# Patient Record
Sex: Female | Born: 1965 | Hispanic: Yes | Marital: Married | Smoking: Never smoker
Health system: Southern US, Community
[De-identification: ages and names within clinical notes are randomized; demographics above are authoritative.]

## PROBLEM LIST (undated history)

## (undated) HISTORY — PX: TONSILLECTOMY: SUR1361

---

## 2011-09-15 ENCOUNTER — Emergency Department
Admission: EM | Admit: 2011-09-15 | Discharge: 2011-09-15 | Disposition: A | Discharge status: Home / Self Care | Payer: 59 | Source: Home / Self Care

## 2011-09-15 ENCOUNTER — Encounter: Payer: Self-pay | Admitting: *Deleted

## 2011-09-15 DIAGNOSIS — J069 Acute upper respiratory infection, unspecified: Secondary | ICD-10-CM

## 2011-09-15 MED ORDER — BENZONATATE 100 MG PO CAPS: 100.0000 mg | ORAL_CAPSULE | Freq: Three times a day (TID) | ORAL | Status: AC | PRN

## 2011-09-15 MED ORDER — AZITHROMYCIN 250 MG PO TABS: ORAL_TABLET | ORAL | Status: AC

## 2011-09-15 NOTE — ED Notes (Signed)
Patient c/o cough x 1 week. Worsening cough in past 3 days. Taken Mucinex. Denies fever.

## 2011-09-15 NOTE — ED Provider Notes (Signed)
History     CSN: [id number]  Arrival date & time 09/15/11  0865   First MD Initiated Contact with Patient 09/15/11 [phone]      Chief Complaint  Patient presents with  . Cough   Patient is a 46 y.o. female presenting with cough.  Cough   URI Symptoms Onset: 1 week Description: rhinorrhea, nasal congestion, cough Modifying factors:  none  Symptoms Nasal discharge: yes Fever: no Sore throat: no Cough: yes Wheezing: no Ear pain: no GI symptoms: mild nausea Sick contacts: yes; coworker w/ ? Bronchitis vs. PNA  Red Flags  Stiff neck: no Dyspnea: no Rash: no Swallowing difficulty: no  Sinusitis Risk Factors Headache/face pain: no Double sickening: no tooth pain: no  Allergy Risk Factors Sneezing: no Itchy scratchy throat: no Seasonal symptoms: no  Flu Risk Factors Headache: no muscle aches: no severe fatigue: no   History reviewed. No pertinent past medical history.  Past Surgical History  Procedure Date  . Tonsillectomy     Family History  Problem Relation Age of Onset  . Heart attack Father   . Hypertension Father   . Hypertension Sister   . Hypertension Brother     History  Substance Use Topics  . Smoking status: Never Smoker   . Smokeless tobacco: Not on file  . Alcohol Use: No    OB History    Grav Para Term Preterm Abortions TAB SAB Ect Mult Living                  Review of Systems  Respiratory: Positive for cough.   All other systems reviewed and are negative.    Allergies  Review of patient's allergies indicates no known allergies.  Home Medications   Current Outpatient Rx  Name Route Sig Dispense Refill  . AZITHROMYCIN 250 MG PO TABS  Take 2 tabs PO x 1 dose, then 1 tab PO QD x 4 days 6 tablet 0  . BENZONATATE 100 MG PO CAPS Oral Take 1-2 capsules (100-200 mg total) by mouth 3 (three) times daily as needed for cough. 40 capsule 0    BP 113/78  Pulse 78  Temp 98.3 F (36.8 C) (Oral)  Resp 18  Ht 5' 2.5" (1.588 m)   Wt 132 lb (59.875 kg)  BMI 23.76 kg/m2  SpO2 100%  LMP 09/01/2011  Physical Exam  Constitutional: She is oriented to person, place, and time. She appears well-developed and well-nourished.  HENT:  Head: Normocephalic and atraumatic.  Right Ear: External ear normal.  Left Ear: External ear normal.       +nasal erythema, rhinorrhea bilaterally, + post oropharyngeal erythema    Eyes: Conjunctivae are normal. Pupils are equal, round, and reactive to light.  Neck: Normal range of motion. Neck supple.  Cardiovascular: Normal rate and regular rhythm.   Pulmonary/Chest: Effort normal and breath sounds normal. No respiratory distress. She has no wheezes. She has no rales.  Abdominal: Soft.  Musculoskeletal: Normal range of motion.  Neurological: She is alert and oriented to person, place, and time.  Skin: Skin is warm.    ED Course  Procedures (including critical care time)  Labs Reviewed - No data to display No results found.   1. URI (upper respiratory infection)       MDM  Viral URI with some concern for bronchitic changes.  Will rx tessalon perles Azithromycin for atypical coverage. Resp status currently reassuring.  Discussed infectious and resp red flags for reevaluation.  Handout given.  Follow up as needed.    The patient and/or caregiver has been counseled thoroughly with regard to treatment plan and/or medications prescribed including dosage, schedule, interactions, rationale for use, and possible side effects and they verbalize understanding. Diagnoses and expected course of recovery discussed and will return if not improved as expected or if the condition worsens. Patient and/or caregiver verbalized understanding.             Floydene Flock, MD 09/15/11 [phone]

## 2011-09-17 NOTE — ED Provider Notes (Signed)
Agree with exam, assessment, and plan.   Stephen A Beese, MD 09/17/11 1400 

## 2012-11-01 ENCOUNTER — Encounter: Payer: Self-pay | Admitting: *Deleted

## 2012-11-01 ENCOUNTER — Emergency Department
Admission: EM | Admit: 2012-11-01 | Discharge: 2012-11-01 | Disposition: A | Discharge status: Home / Self Care | Payer: BC Managed Care – PPO | Source: Home / Self Care | Attending: Family Medicine | Admitting: Family Medicine

## 2012-11-01 DIAGNOSIS — J069 Acute upper respiratory infection, unspecified: Secondary | ICD-10-CM

## 2012-11-01 NOTE — ED Provider Notes (Signed)
CSN: [id number]     Arrival date & time 11/01/12  1519 History   First MD Initiated Contact with Patient 11/01/12 1532     Chief Complaint  Patient presents with  . Cough    HPI  URI Symptoms Onset: 4 days  Description: rhinorrhea, post nasal drip, cough  Modifying factors:  None   Symptoms Nasal discharge: yes Fever: no Sore throat: no Cough: yes Wheezing: no Ear pain: no GI symptoms: no Sick contacts: no  Red Flags  Stiff neck: no Dyspnea: no Rash: no Swallowing difficulty: no  Sinusitis Risk Factors Headache/face pain: no Double sickening: no tooth pain: no  Allergy Risk Factors Sneezing: no Itchy scratchy throat: no Seasonal symptoms: no  Flu Risk Factors Headache: no muscle aches: no severe fatigue: no   History reviewed. No pertinent past medical history. Past Surgical History  Procedure Laterality Date  . Tonsillectomy     Family History  Problem Relation Age of Onset  . Heart attack Father   . Hypertension Father   . Hypertension Sister   . Hypertension Brother    History  Substance Use Topics  . Smoking status: Never Smoker   . Smokeless tobacco: Not on file  . Alcohol Use: No   OB History   Grav Para Term Preterm Abortions TAB SAB Ect Mult Living                 Review of Systems  All other systems reviewed and are negative.    Allergies  Review of patient's allergies indicates no known allergies.  Home Medications  No current outpatient prescriptions on file. BP 104/70  Pulse 96  Temp(Src) 98.2 F (36.8 C) (Oral)  Resp 16  Wt 134 lb (60.782 kg)  BMI 24.1 kg/m2  SpO2 98%  LMP 10/26/2012 Physical Exam  Constitutional: She appears well-developed and well-nourished.  HENT:  Head: Normocephalic and atraumatic.  Right Ear: External ear normal.  Left Ear: External ear normal.  +nasal erythema, rhinorrhea bilaterally, + post oropharyngeal erythema    Eyes: Conjunctivae are normal. Pupils are equal, round, and reactive  to light.  Neck: Normal range of motion. Neck supple.  Cardiovascular: Normal rate and regular rhythm.   Pulmonary/Chest: Effort normal and breath sounds normal. She has no wheezes. She has no rales.  Abdominal: Soft.  Musculoskeletal: Normal range of motion.  Neurological: She is alert.  Skin: Skin is warm.    ED Course  Procedures (including critical care time) Labs Review Labs Reviewed - No data to display Imaging Review No results found.  MDM   1. URI (upper respiratory infection)    Likely viral URI  Discussed supportive care and infectious/resp red flags.  Follow up as needed.     The patient and/or caregiver has been counseled thoroughly with regard to treatment plan and/or medications prescribed including dosage, schedule, interactions, rationale for use, and possible side effects and they verbalize understanding. Diagnoses and expected course of recovery discussed and will return if not improved as expected or if the condition worsens. Patient and/or caregiver verbalized understanding.         Doree Albee, MD 11/01/12 [phone]

## 2012-11-01 NOTE — ED Notes (Signed)
Pt c/o cough x 4 days, worse x 1 day. She reports that the cough is productive sometimes in the mornings. Denies fever. She has taken Mucinex.

## 2012-11-04 ENCOUNTER — Telehealth: Payer: Self-pay | Admitting: Emergency Medicine

## 2015-05-06 ENCOUNTER — Emergency Department
Admission: EM | Admit: 2015-05-06 | Discharge: 2015-05-06 | Disposition: A | Discharge status: Home / Self Care | Payer: BLUE CROSS/BLUE SHIELD | Source: Home / Self Care | Attending: Family Medicine | Admitting: Family Medicine

## 2015-05-06 ENCOUNTER — Encounter: Payer: Self-pay | Admitting: *Deleted

## 2015-05-06 DIAGNOSIS — R053 Chronic cough: Secondary | ICD-10-CM

## 2015-05-06 DIAGNOSIS — J069 Acute upper respiratory infection, unspecified: Secondary | ICD-10-CM

## 2015-05-06 DIAGNOSIS — R05 Cough: Secondary | ICD-10-CM | POA: Diagnosis not present

## 2015-05-06 MED ORDER — PREDNISONE 20 MG PO TABS: ORAL_TABLET | ORAL | Status: DC

## 2015-05-06 MED ORDER — DOXYCYCLINE HYCLATE 100 MG PO CAPS: 100.0000 mg | ORAL_CAPSULE | Freq: Two times a day (BID) | ORAL | Status: DC

## 2015-05-06 MED ORDER — BENZONATATE 100 MG PO CAPS: 100.0000 mg | ORAL_CAPSULE | Freq: Three times a day (TID) | ORAL | Status: DC

## 2015-05-06 NOTE — ED Notes (Signed)
Pt c/o 2 weeks of cough, congestion, HA and sneezing. Taken Mucinex and cough drops. Before leaving [place] 8 days ago, took 3 days of 500mg  azithromycin.

## 2015-05-06 NOTE — Discharge Instructions (Signed)
You may take 400-600mg  Ibuprofen (Motrin) every 6-8 hours for fever and pain  Alternate with Tylenol  You may take 500mg  Tylenol every 4-6 hours as needed for fever and pain  Follow-up with your primary care provider next week for recheck of symptoms if not improving.  Be sure to drink plenty of fluids and rest, at least 8hrs of sleep a night, preferably more while you are sick. Return urgent care or go to closest ER if you cannot keep down fluids/signs of dehydration, fever not reducing with Tylenol, difficulty breathing/wheezing, stiff neck, worsening condition, or other concerns (see below)   If coughing not improving with cough medication and prednisone, you may start taking the antibiotics in 2-3 days, or if persistent fever develops.  Cool Mist Vaporizers Vaporizers may help relieve the symptoms of a cough and cold. They add moisture to the air, which helps mucus to become thinner and less sticky. This makes it easier to breathe and cough up secretions. Cool mist vaporizers do not cause serious burns like hot mist vaporizers, which may also be called steamers or humidifiers. Vaporizers have not been proven to help with colds. You should not use a vaporizer if you are allergic to mold. HOME CARE INSTRUCTIONS  Follow the package instructions for the vaporizer.  Do not use anything other than distilled water in the vaporizer.  Do not run the vaporizer all of the time. This can cause mold or bacteria to grow in the vaporizer.  Clean the vaporizer after each time it is used.  Clean and dry the vaporizer well before storing it.  Stop using the vaporizer if worsening respiratory symptoms develop.   This information is not intended to replace advice given to you by your health care provider. Make sure you discuss any questions you have with your health care provider.   Document Released: 10/31/2003 Document Revised: 02/07/2013 Document Reviewed: 06/22/2012 Elsevier Interactive Patient  Education 2016 [employer]  Cough, Adult A cough helps to clear your throat and lungs. A cough may last only 2-3 weeks (acute), or it may last longer than 8 weeks (chronic). Many different things can cause a cough. A cough may be a sign of an illness or another medical condition. HOME CARE  Pay attention to any changes in your cough.  Take medicines only as told by your doctor.  If you were prescribed an antibiotic medicine, take it as told by your doctor. Do not stop taking it even if you start to feel better.  Talk with your doctor before you try using a cough medicine.  Drink enough fluid to keep your pee (urine) clear or pale yellow.  If the air is dry, use a cold steam vaporizer or humidifier in your home.  Stay away from things that make you cough at work or at home.  If your cough is worse at night, try using extra pillows to raise your head up higher while you sleep.  Do not smoke, and try not to be around smoke. If you need help quitting, ask your doctor.  Do not have caffeine.  Do not drink alcohol.  Rest as needed. GET HELP IF:  You have new problems (symptoms).  You cough up yellow fluid (pus).  Your cough does not get better after 2-3 weeks, or your cough gets worse.  Medicine does not help your cough and you are not sleeping well.  You have pain that gets worse or pain that is not helped with medicine.  You have  a fever.  You are losing weight and you do not know why.  You have night sweats. GET HELP RIGHT AWAY IF:  You cough up blood.  You have trouble breathing.  Your heartbeat is very fast.   This information is not intended to replace advice given to you by your health care provider. Make sure you discuss any questions you have with your health care provider.   Document Released: 10/16/2010 Document Revised: 10/24/2014 Document Reviewed: 04/11/2014 Elsevier Interactive Patient Education [employer]

## 2015-05-06 NOTE — ED Provider Notes (Signed)
CSN: [id number]     Arrival date & time 05/06/15  [id number] History   First MD Initiated Contact with Patient 05/06/15 1003     Chief Complaint  Patient presents with  . Cough  . Nasal Congestion   (Consider location/radiation/quality/duration/timing/severity/associated sxs/prior Treatment) HPI  The pt is a 50yo female presenting to [facility] with c/o 2 weeks of cough, congestion, generalized headache and sneezing.  She has been taking OTC Mucinex and using cough drops but only minimal relief.  She does report taking 3 days of 500mg  Azithromycin 8 days ago when she was in [place] but states she only felt better for about 3-4 days, then cough returned.  Denies hx of asthma. Her husband is sick at home but does not have much of a cough.  Denies fever, chills, n/v/d.  History reviewed. No pertinent past medical history. Past Surgical History  Procedure Laterality Date  . Tonsillectomy     Family History  Problem Relation Age of Onset  . Heart attack Father   . Hypertension Father   . Hypertension Sister   . Hypertension Brother    Social History  Substance Use Topics  . Smoking status: Never Smoker   . Smokeless tobacco: None  . Alcohol Use: No   OB History    No data available     Review of Systems  Constitutional: Negative for fever and chills.  HENT: Positive for congestion and sneezing. Negative for ear pain, sore throat, trouble swallowing and voice change.   Respiratory: Positive for cough. Negative for shortness of breath.   Cardiovascular: Negative for chest pain and palpitations.  Gastrointestinal: Negative for nausea, vomiting, abdominal pain and diarrhea.  Musculoskeletal: Negative for myalgias, back pain and arthralgias.  Skin: Negative for rash.  Neurological: Positive for headaches. Negative for dizziness and light-headedness.    Allergies  Review of patient's allergies indicates no known allergies.  Home Medications   Prior to Admission medications   Medication  Sig Start Date End Date Taking? Authorizing Provider  benzonatate (TESSALON) 100 MG capsule Take 1-2 capsules (100-200 mg total) by mouth every 8 (eight) hours. 05/06/15   Junius FinnerErin O'Malley, PA-C  doxycycline (VIBRAMYCIN) 100 MG capsule Take 1 capsule (100 mg total) by mouth 2 (two) times daily. One po bid x 7 days 05/06/15   Junius FinnerErin O'Malley, PA-C  predniSONE (DELTASONE) 20 MG tablet 3 tabs po day one, then 2 po daily x 4 days 05/06/15   Junius FinnerErin O'Malley, PA-C   Meds Ordered and Administered this Visit  Medications - No data to display  BP 129/72 mmHg  Pulse 88  Temp(Src) 97.9 F (36.6 C) (Oral)  Resp 16  Wt 138 lb (62.596 kg)  SpO2 99%  LMP 04/22/2015 No data found.   Physical Exam  Constitutional: She appears well-developed and well-nourished. No distress.  HENT:  Head: Normocephalic and atraumatic.  Right Ear: Tympanic membrane normal.  Left Ear: Tympanic membrane normal.  Nose: Nose normal.  Mouth/Throat: Uvula is midline, oropharynx is clear and moist and mucous membranes are normal.  Eyes: Conjunctivae are normal. No scleral icterus.  Neck: Normal range of motion. Neck supple.  Cardiovascular: Normal rate, regular rhythm and normal heart sounds.   Pulmonary/Chest: Effort normal and breath sounds normal. No stridor. No respiratory distress. She has no wheezes. She has no rales. She exhibits no tenderness.  Lungs: clear, intermittent mild dry cough during exam.  Abdominal: Soft. She exhibits no distension. There is no tenderness.  Musculoskeletal: Normal range of motion.  Lymphadenopathy:    She has no cervical adenopathy.  Neurological: She is alert.  Skin: Skin is warm and dry. She is not diaphoretic.  Nursing note and vitals reviewed.   ED Course  Procedures (including critical care time)  Labs Review Labs Reviewed - No data to display  Imaging Review No results found.   MDM   1. Persistent cough   2. Acute upper respiratory infection    Pt c/o persistent cough  despite completing a course of Azithromycin 1 week ago.  Lungs: CTAB. Pt is afebrile, O2 Sat 99% on RA  Rx: prednisone and tessalon Prescription for doxycycline provided. Advised to fill in 2-3 days if symptoms not improving or persistent fever develops.  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 Patient verbalized understanding and agreement with treatment plan.     Junius Finner, PA-C 05/06/15 1028

## 2019-12-15 DIAGNOSIS — N390 Urinary tract infection, site not specified: Secondary | ICD-10-CM | POA: Diagnosis not present

## 2020-01-25 ENCOUNTER — Other Ambulatory Visit: Payer: Self-pay | Admitting: Internal Medicine

## 2020-01-25 DIAGNOSIS — Z780 Asymptomatic menopausal state: Secondary | ICD-10-CM | POA: Diagnosis not present

## 2020-01-25 DIAGNOSIS — Z114 Encounter for screening for human immunodeficiency virus [HIV]: Secondary | ICD-10-CM | POA: Diagnosis not present

## 2020-01-25 DIAGNOSIS — R69 Illness, unspecified: Secondary | ICD-10-CM | POA: Diagnosis not present

## 2020-01-25 DIAGNOSIS — Z1231 Encounter for screening mammogram for malignant neoplasm of breast: Secondary | ICD-10-CM

## 2020-01-25 DIAGNOSIS — Z0000 Encounter for general adult medical examination without abnormal findings: Secondary | ICD-10-CM | POA: Diagnosis not present

## 2020-01-25 DIAGNOSIS — Z1159 Encounter for screening for other viral diseases: Secondary | ICD-10-CM | POA: Diagnosis not present

## 2020-02-07 DIAGNOSIS — Z20822 Contact with and (suspected) exposure to covid-19: Secondary | ICD-10-CM | POA: Diagnosis not present

## 2020-02-29 DIAGNOSIS — Z20822 Contact with and (suspected) exposure to covid-19: Secondary | ICD-10-CM | POA: Diagnosis not present

## 2020-03-05 DIAGNOSIS — Z20822 Contact with and (suspected) exposure to covid-19: Secondary | ICD-10-CM | POA: Diagnosis not present

## 2020-03-11 DIAGNOSIS — Z20822 Contact with and (suspected) exposure to covid-19: Secondary | ICD-10-CM | POA: Diagnosis not present

## 2020-05-08 ENCOUNTER — Other Ambulatory Visit: Payer: Self-pay | Admitting: *Deleted

## 2020-05-08 ENCOUNTER — Other Ambulatory Visit: Payer: Self-pay

## 2020-05-08 ENCOUNTER — Inpatient Hospital Stay
Admission: RE | Admit: 2020-05-08 | Discharge: 2020-05-08 | Disposition: A | Discharge status: Home / Self Care | Payer: Self-pay | Source: Ambulatory Visit | Attending: *Deleted | Admitting: *Deleted

## 2020-05-08 ENCOUNTER — Ambulatory Visit
Admission: RE | Admit: 2020-05-08 | Discharge: 2020-05-08 | Disposition: A | Discharge status: Home / Self Care | Payer: 59 | Source: Ambulatory Visit | Attending: Internal Medicine | Admitting: Internal Medicine

## 2020-05-08 DIAGNOSIS — Z1231 Encounter for screening mammogram for malignant neoplasm of breast: Secondary | ICD-10-CM | POA: Diagnosis not present

## 2021-04-24 IMAGING — MG MM DIGITAL SCREENING BILAT W/ TOMO AND CAD
8 series · 8 of 24 positions shown · non-contrast
Comparison: Previous exam(s).

CLINICAL DATA: Screening.

EXAM:
DIGITAL SCREENING BILATERAL MAMMOGRAM WITH TOMOSYNTHESIS AND CAD
TECHNIQUE: Bilateral screening digital craniocaudal and mediolateral oblique
mammograms were obtained. Bilateral screening digital breast
tomosynthesis was performed. The images were evaluated with
computer-aided detection.

[L CC synth-2D]
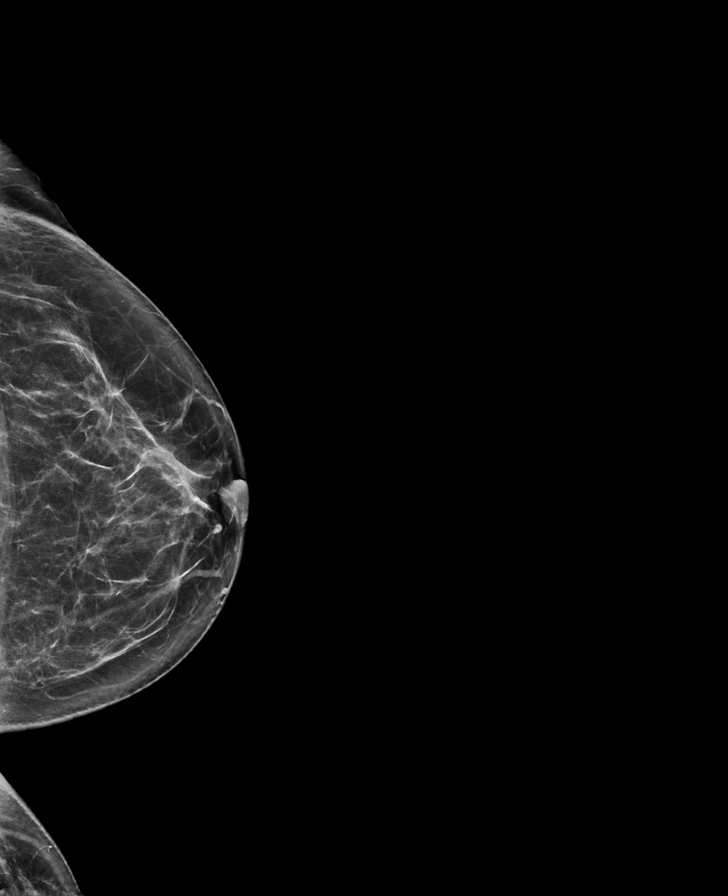

[R CC synth-2D]
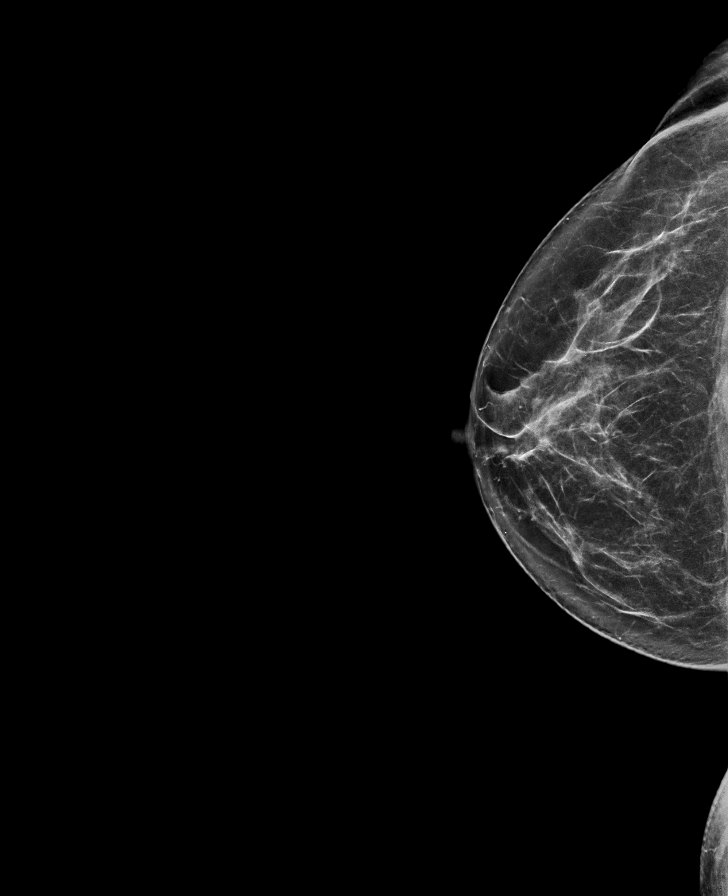

[L MLO synth-2D]
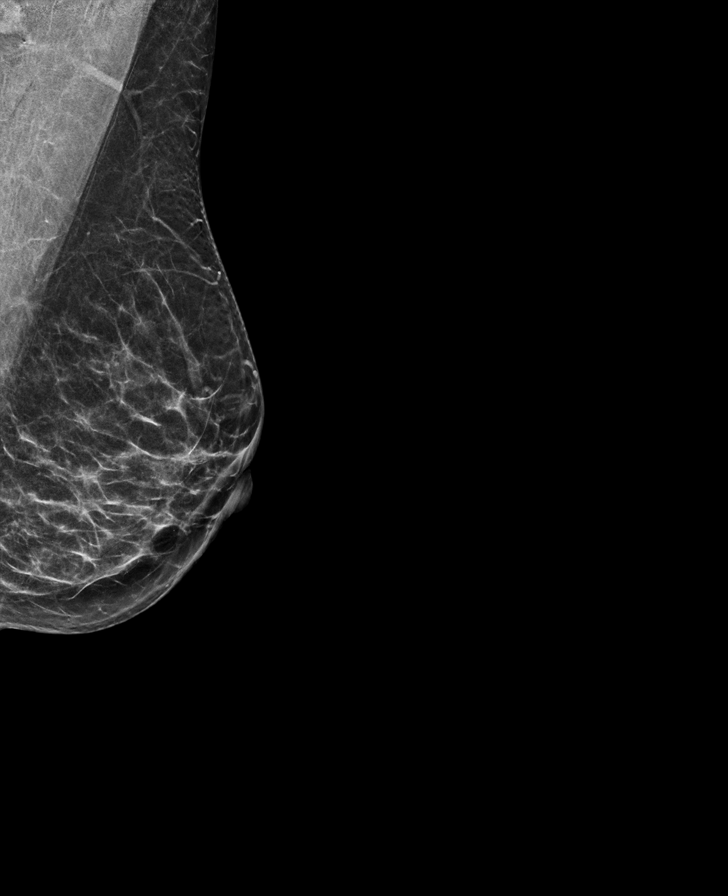

[R MLO synth-2D]
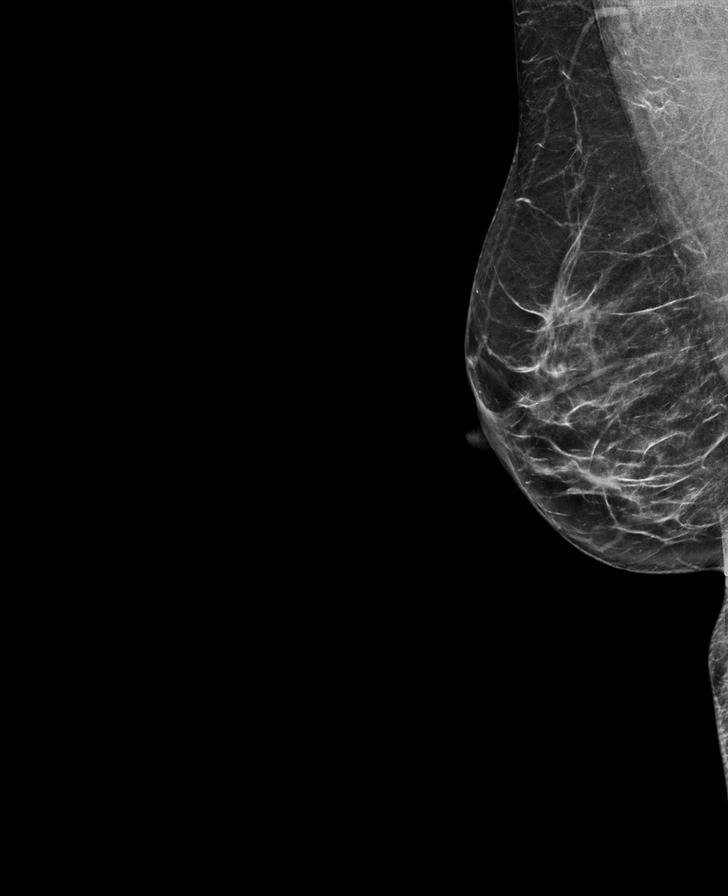

[L MLO tomo · tomo slice 28/55.0]
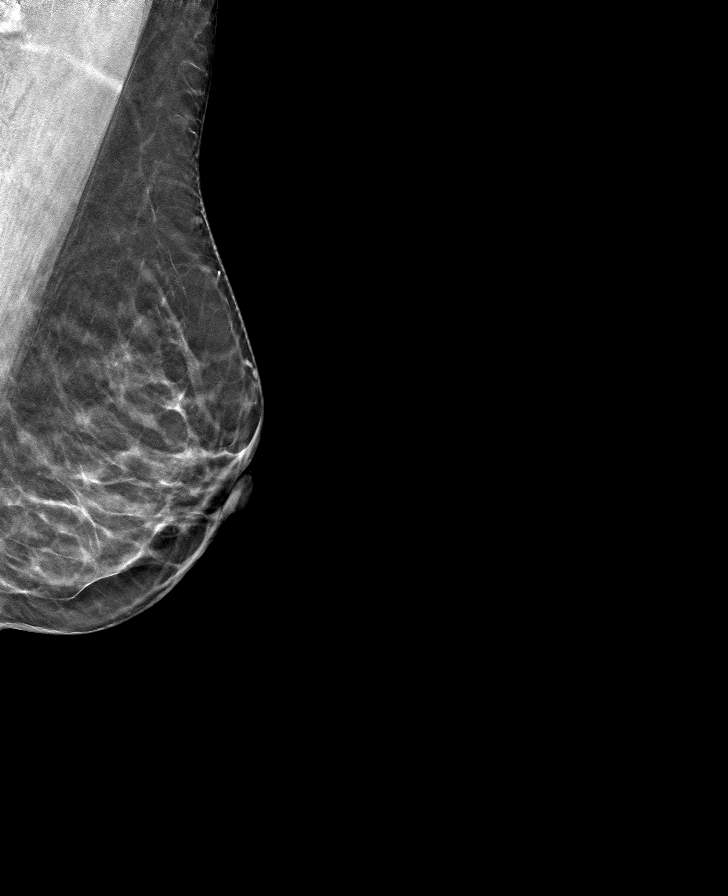

[R CC tomo · tomo slice 35/70.0]
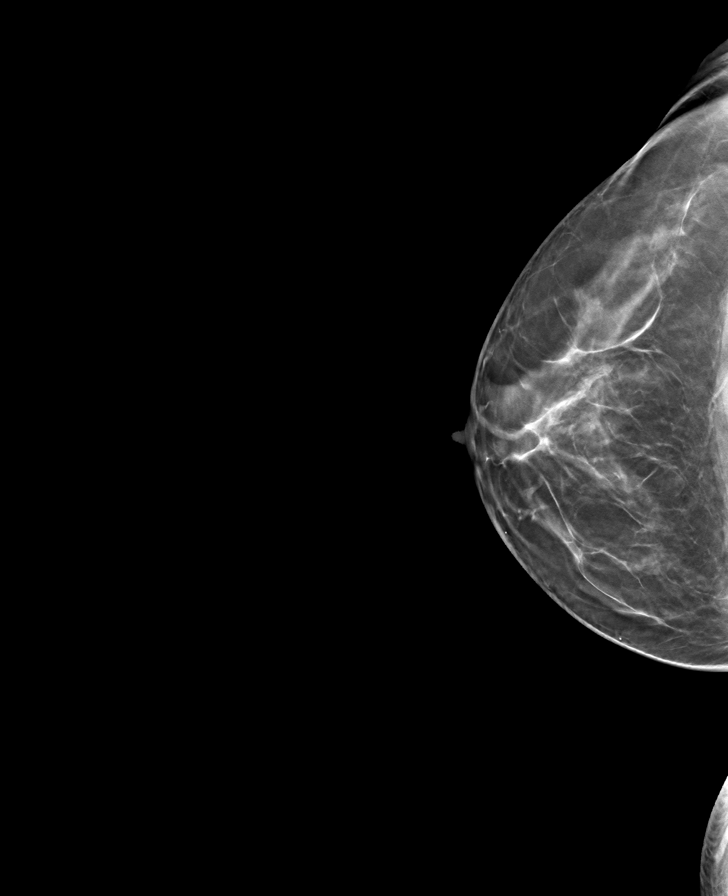

[L CC tomo · tomo slice 32/63.0]
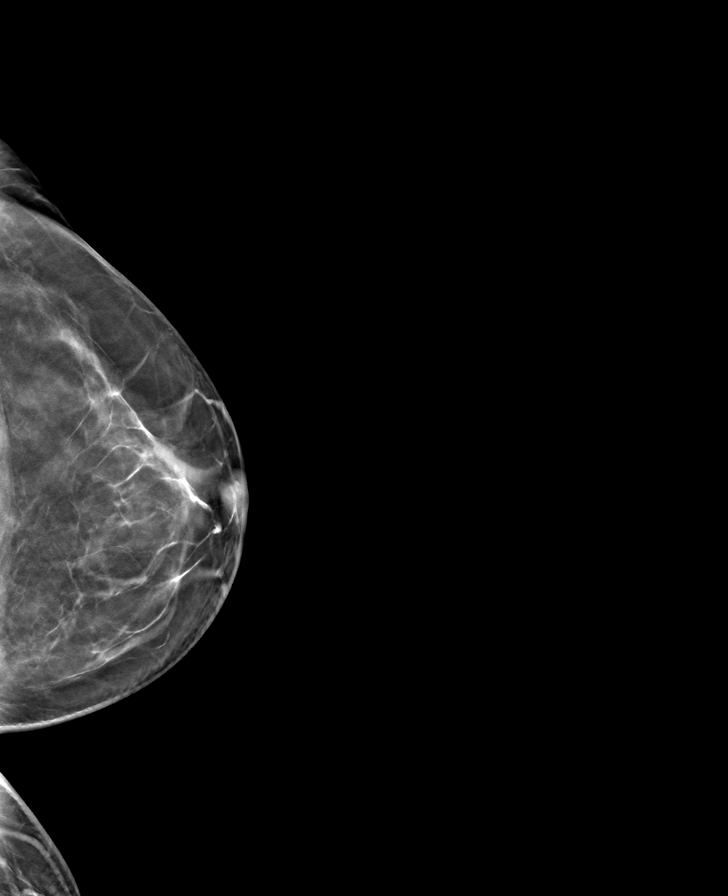

[R MLO tomo · tomo slice 33/66.0]
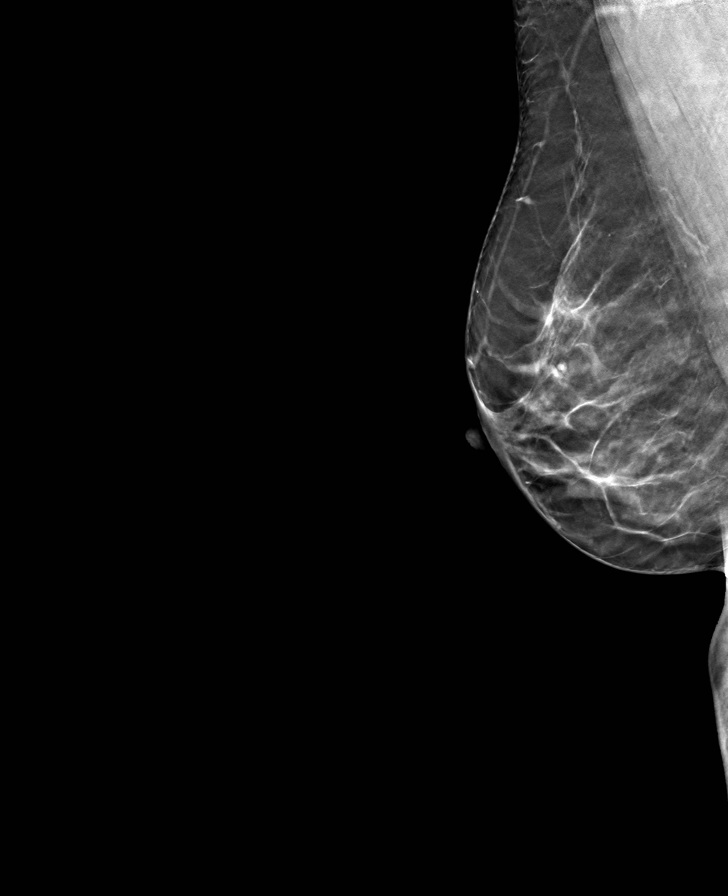

[8 of 24 positions shown; findings below may reference images not displayed]

ACR Breast Density Category b: There are scattered areas of
fibroglandular density.
FINDINGS: There are no findings suspicious for malignancy. The images were
evaluated with computer-aided detection.
IMPRESSION: No mammographic evidence of malignancy. A result letter of this
screening mammogram will be mailed directly to the patient.

RECOMMENDATION:
Screening mammogram in one year. (Code:WJ-I-BG6)

BI-RADS CATEGORY  1: Negative.

## 2021-07-30 ENCOUNTER — Other Ambulatory Visit: Payer: Self-pay | Admitting: Internal Medicine

## 2021-07-30 DIAGNOSIS — Z1231 Encounter for screening mammogram for malignant neoplasm of breast: Secondary | ICD-10-CM

## 2021-09-03 ENCOUNTER — Ambulatory Visit
Admission: RE | Admit: 2021-09-03 | Discharge: 2021-09-03 | Disposition: A | Discharge status: Home / Self Care | Payer: 59 | Source: Ambulatory Visit | Attending: Internal Medicine | Admitting: Internal Medicine

## 2021-09-03 DIAGNOSIS — Z1231 Encounter for screening mammogram for malignant neoplasm of breast: Secondary | ICD-10-CM | POA: Insufficient documentation

## 2021-09-05 ENCOUNTER — Other Ambulatory Visit: Payer: Self-pay | Admitting: Family Medicine

## 2021-09-05 DIAGNOSIS — N6489 Other specified disorders of breast: Secondary | ICD-10-CM

## 2021-09-05 DIAGNOSIS — R928 Other abnormal and inconclusive findings on diagnostic imaging of breast: Secondary | ICD-10-CM

## 2021-09-10 ENCOUNTER — Ambulatory Visit
Admission: RE | Admit: 2021-09-10 | Discharge: 2021-09-10 | Disposition: A | Discharge status: Home / Self Care | Payer: 59 | Source: Ambulatory Visit | Attending: Family Medicine | Admitting: Family Medicine

## 2021-09-10 DIAGNOSIS — N6489 Other specified disorders of breast: Secondary | ICD-10-CM | POA: Insufficient documentation

## 2021-09-10 DIAGNOSIS — R928 Other abnormal and inconclusive findings on diagnostic imaging of breast: Secondary | ICD-10-CM | POA: Insufficient documentation

## 2021-10-10 ENCOUNTER — Other Ambulatory Visit: Payer: Self-pay | Admitting: Internal Medicine

## 2021-10-10 DIAGNOSIS — G8929 Other chronic pain: Secondary | ICD-10-CM

## 2021-11-03 ENCOUNTER — Ambulatory Visit
Admission: RE | Admit: 2021-11-03 | Discharge: 2021-11-03 | Disposition: A | Discharge status: Home / Self Care | Payer: 59 | Source: Ambulatory Visit | Attending: Internal Medicine | Admitting: Internal Medicine

## 2021-11-03 DIAGNOSIS — G8929 Other chronic pain: Secondary | ICD-10-CM

## 2022-02-06 ENCOUNTER — Other Ambulatory Visit: Payer: Self-pay

## 2022-02-06 DIAGNOSIS — R7989 Other specified abnormal findings of blood chemistry: Secondary | ICD-10-CM

## 2022-03-13 ENCOUNTER — Ambulatory Visit
Admission: RE | Admit: 2022-03-13 | Discharge: 2022-03-13 | Disposition: A | Discharge status: Home / Self Care | Payer: 59 | Source: Ambulatory Visit | Attending: Internal Medicine | Admitting: Internal Medicine

## 2022-03-13 DIAGNOSIS — R7989 Other specified abnormal findings of blood chemistry: Secondary | ICD-10-CM | POA: Diagnosis present

## 2022-11-04 ENCOUNTER — Ambulatory Visit
Admission: RE | Admit: 2022-11-04 | Discharge: 2022-11-04 | Disposition: A | Discharge status: Home / Self Care | Payer: 59 | Source: Ambulatory Visit | Attending: Physician Assistant | Admitting: Physician Assistant

## 2022-11-04 ENCOUNTER — Other Ambulatory Visit: Payer: Self-pay | Admitting: Physician Assistant

## 2022-11-04 DIAGNOSIS — R1012 Left upper quadrant pain: Secondary | ICD-10-CM | POA: Diagnosis present

## 2023-03-18 ENCOUNTER — Other Ambulatory Visit: Payer: Self-pay | Admitting: Internal Medicine

## 2023-03-18 DIAGNOSIS — Z1231 Encounter for screening mammogram for malignant neoplasm of breast: Secondary | ICD-10-CM

## 2023-10-07 ENCOUNTER — Ambulatory Visit

## 2023-10-07 ENCOUNTER — Other Ambulatory Visit: Payer: Self-pay

## 2023-10-07 DIAGNOSIS — L821 Other seborrheic keratosis: Secondary | ICD-10-CM

## 2023-10-07 DIAGNOSIS — B351 Tinea unguium: Secondary | ICD-10-CM

## 2023-10-07 DIAGNOSIS — D485 Neoplasm of uncertain behavior of skin: Secondary | ICD-10-CM

## 2023-10-07 DIAGNOSIS — D229 Melanocytic nevi, unspecified: Secondary | ICD-10-CM

## 2023-10-07 DIAGNOSIS — L578 Other skin changes due to chronic exposure to nonionizing radiation: Secondary | ICD-10-CM

## 2023-10-07 DIAGNOSIS — Z1283 Encounter for screening for malignant neoplasm of skin: Secondary | ICD-10-CM | POA: Diagnosis not present

## 2023-10-07 DIAGNOSIS — W908XXA Exposure to other nonionizing radiation, initial encounter: Secondary | ICD-10-CM

## 2023-10-07 DIAGNOSIS — C4401 Basal cell carcinoma of skin of lip: Secondary | ICD-10-CM | POA: Diagnosis not present

## 2023-10-07 DIAGNOSIS — D492 Neoplasm of unspecified behavior of bone, soft tissue, and skin: Secondary | ICD-10-CM

## 2023-10-07 DIAGNOSIS — L814 Other melanin hyperpigmentation: Secondary | ICD-10-CM

## 2023-10-07 DIAGNOSIS — L988 Other specified disorders of the skin and subcutaneous tissue: Secondary | ICD-10-CM

## 2023-10-07 DIAGNOSIS — L819 Disorder of pigmentation, unspecified: Secondary | ICD-10-CM

## 2023-10-07 DIAGNOSIS — C4491 Basal cell carcinoma of skin, unspecified: Secondary | ICD-10-CM

## 2023-10-07 DIAGNOSIS — D1801 Hemangioma of skin and subcutaneous tissue: Secondary | ICD-10-CM

## 2023-10-07 DIAGNOSIS — D489 Neoplasm of uncertain behavior, unspecified: Secondary | ICD-10-CM

## 2023-10-07 HISTORY — DX: Basal cell carcinoma of skin, unspecified: C44.91

## 2023-10-07 MED ORDER — TRETINOIN 0.025 % EX CREA: TOPICAL_CREAM | Freq: Every day | CUTANEOUS | 0 refills | Status: AC

## 2023-10-07 NOTE — Patient Instructions (Addendum)
 Start Tretinoin  0.25% cream pea size amount to face nightly as tolerated  Topical retinoid medications like tretinoin /Retin-A , adapalene/Differin, tazarotene/Fabior, and Epiduo/Epiduo Forte can cause dryness and irritation when first started. Only apply a pea-sized amount to the entire affected area. Avoid applying it around the eyes, edges of mouth and creases at the nose. If you experience irritation, use a good moisturizer first and/or apply the medicine less often. If you are doing well with the medicine, you can increase how often you use it until you are applying every night. Be careful with sun protection while using this medication as it can make you sensitive to the sun. This medicine should not be used by pregnant women.     Wound Care Instructions  Cleanse wound gently with soap and water once a day then pat dry with clean gauze. Apply a thin coat of Petrolatum (petroleum jelly, Vaseline) over the wound (unless you have an allergy to this). We recommend that you use a new, sterile tube of Vaseline. Do not pick or remove scabs. Do not remove the yellow or white healing tissue from the base of the wound.  Cover the wound with fresh, clean, nonstick gauze and secure with paper tape. You may use Band-Aids in place of gauze and tape if the wound is small enough, but would recommend trimming much of the tape off as there is often too much. Sometimes Band-Aids can irritate the skin.  You should call the office for your biopsy report after 1 week if you have not already been contacted.  If you experience any problems, such as abnormal amounts of bleeding, swelling, significant bruising, significant pain, or evidence of infection, please call the office immediately.  FOR ADULT SURGERY PATIENTS: If you need something for pain relief you may take 1 extra strength Tylenol (acetaminophen) AND 2 Ibuprofen (200mg  each) together every 4 hours as needed for pain. (do not take these if you are allergic  to them or if you have a reason you should not take them.) Typically, you may only need pain medication for 1 to 3 days.     Due to recent changes in healthcare laws, you may see results of your pathology and/or laboratory studies on MyChart before the doctors have had a chance to review them. We understand that in some cases there may be results that are confusing or concerning to you. Please understand that not all results are received at the same time and often the doctors may need to interpret multiple results in order to provide you with the best plan of care or course of treatment. Therefore, we ask that you please give us  2 business days to thoroughly review all your results before contacting the office for clarification. Should we see a critical lab result, you will be contacted sooner.   If You Need Anything After Your Visit  If you have any questions or concerns for your doctor, please call our main line at [phone] and press option 4 to reach your doctor's medical assistant. If no one answers, please leave a voicemail as directed and we will return your call as soon as possible. Messages left after 4 pm will be answered the following business day.   You may also send us  a message via MyChart. We typically respond to MyChart messages within 1-2 business days.  For prescription refills, please ask your pharmacy to contact our office. Our fax number is [phone].  If you have an urgent issue when the clinic is closed that  cannot wait until the next business day, you can page your doctor at the number below.    Please note that while we do our best to be available for urgent issues outside of office hours, we are not available 24/7.   If you have an urgent issue and are unable to reach us , you may choose to seek medical care at your doctor's office, retail clinic, urgent care center, or emergency room.  If you have a medical emergency, please immediately call 911 or go to the  emergency department.  Pager Numbers  - Dr. Hester: [phone]  - Dr. Jackquline: [phone]  - Dr. Claudene: [phone]   - Dr. Raymund: [phone]  In the event of inclement weather, please call our main line at [phone] for an update on the status of any delays or closures.  Dermatology Medication Tips: Please keep the boxes that topical medications come in in order to help keep track of the instructions about where and how to use these. Pharmacies typically print the medication instructions only on the boxes and not directly on the medication tubes.   If your medication is too expensive, please contact our office at [phone] option 4 or send us  a message through MyChart.   We are unable to tell what your co-pay for medications will be in advance as this is different depending on your insurance coverage. However, we may be able to find a substitute medication at lower cost or fill out paperwork to get insurance to cover a needed medication.   If a prior authorization is required to get your medication covered by your insurance company, please allow us  1-2 business days to complete this process.  Drug prices often vary depending on where the prescription is filled and some pharmacies may offer cheaper prices.  The website www.goodrx.com contains coupons for medications through different pharmacies. The prices here do not account for what the cost may be with help from insurance (it may be cheaper with your insurance), but the website can give you the price if you did not use any insurance.  - You can print the associated coupon and take it with your prescription to the pharmacy.  - You may also stop by our office during regular business hours and pick up a GoodRx coupon card.  - If you need your prescription sent electronically to a different pharmacy, notify our office through [facility] or by phone at [phone] option 4.     Si Usted Necesita Algo Despus de  Su Visita  Tambin puede enviarnos un mensaje a travs de Clinical cytogeneticist. Por lo general respondemos a los mensajes de MyChart en el transcurso de 1 a 2 das hbiles.  Para renovar recetas, por favor pida a su farmacia que se ponga en contacto con nuestra oficina. Randi lakes de fax es [place] [phone].  Si tiene un asunto urgente cuando la clnica est cerrada y que no puede esperar hasta el siguiente da hbil, puede llamar/localizar a su doctor(a) al nmero que aparece a continuacin.   Por favor, tenga en cuenta que aunque hacemos todo lo posible para estar disponibles para asuntos urgentes fuera del horario de [place], no estamos disponibles las 24 horas del da, los 7 [address]  [address] de la [place].   Si tiene un problema urgente y no puede comunicarse con nosotros, puede optar por buscar atencin mdica  en el consultorio de su doctor(a), en una clnica privada, en un centro de atencin urgente o en una sala de emergencias.  Si  tiene una emergencia mdica, por favor llame inmediatamente al 911 o vaya a la sala de emergencias.  Nmeros de bper  - Dr. Hester: [phone]  - Dra. Jackquline: [phone]  - Dr. Claudene: [phone]  - Dra. Kitts: [phone]  En caso de inclemencias del [place], por favor llame a nuestra lnea principal al [phone] para una actualizacin sobre el estado de cualquier retraso o cierre.  Consejos para la medicacin en dermatologa: Por favor, guarde las cajas en las que vienen los medicamentos de uso tpico para ayudarle a seguir las instrucciones sobre dnde y cmo usarlos. Las farmacias generalmente imprimen las instrucciones del medicamento slo en las cajas y no directamente en los tubos del [place].   Si su medicamento es muy caro, por favor, pngase en contacto con landry rieger llamando al [phone] y presione la opcin 4 o envenos un mensaje a travs de Clinical cytogeneticist.   No podemos decirle cul ser su copago por los medicamentos por adelantado ya que  esto es diferente dependiendo de la cobertura de su seguro. Sin embargo, es posible que podamos encontrar un medicamento sustituto a Audiological scientist un formulario para que el seguro cubra el medicamento que se considera necesario.   Si se requiere una autorizacin previa para que su compaa de seguros malta su medicamento, por favor permtanos de 1 a 2 das hbiles para completar este proceso.  Los precios de los medicamentos varan con frecuencia dependiendo del Environmental consultant de dnde se surte la receta y alguna farmacias pueden ofrecer precios ms baratos.  El sitio web www.goodrx.com tiene cupones para medicamentos de Health and safety inspector. Los precios aqu no tienen en cuenta lo que podra costar con la ayuda del seguro (puede ser ms barato con su seguro), pero el sitio web puede darle el precio si no utiliz Tourist information centre manager.  - Puede imprimir el cupn correspondiente y llevarlo con su receta a la farmacia.  - Tambin puede pasar por nuestra oficina durante el horario de atencin regular y Education officer, museum una tarjeta de cupones de GoodRx.  - Si necesita que su receta se enve electrnicamente a una farmacia diferente, informe a nuestra oficina a travs de MyChart de [place] o por telfono llamando al [phone] y presione la opcin 4.

## 2023-10-07 NOTE — Progress Notes (Addendum)
 New Patient Visit   Subjective  Deborah Mcdonald is a 58 y.o. female who presents for the following: Skin Cancer Screening and Full Body Skin Exam, check spot L upper lip >74yr, getting larger, no treatment, check toenails, pt not sure if a fungus, ~10 yrs ago she had a lot of itching on feet and previous dermatologist advised it was not fungus was a circulation issue, no hx of skin cancer, no fhx of skin cancer. Also with wrinkles, hyperpigmentation of face.   The patient presents for Total-Body Skin Exam (TBSE) for skin cancer screening and mole check. The patient has spots, moles and lesions to be evaluated, some may be new or changing and the patient may have concern these could be cancer.    The following portions of the chart were reviewed this encounter and updated as appropriate: medications, allergies, medical history  Review of Systems:  No other skin or systemic complaints except as noted in HPI or Assessment and Plan.  Objective  Well appearing patient in no apparent distress; mood and affect are within normal limits.  A full examination was performed including scalp, head, eyes, ears, nose, lips, neck, chest, axillae, abdomen, back, buttocks, bilateral upper extremities, bilateral lower extremities, hands, feet, fingers, toes, fingernails, and toenails. All findings within normal limits unless otherwise noted below.   - 6-20 mm pigmented macules that are tan to brown in color and are somewhat non-uniform in shape and concentrated in the sun-exposed areas - Multiple stuck-on brown, tan and grey papillated papules and plaques on trunk  - Cherry-red vascular papule(s) on trunk  - Actinic Elastosis: chronic sun damage: dyspigmentation, telangiectasia, and wrinkling - Distal whitening, thickening noted of toenails   Relevant physical exam findings are noted in the Assessment and Plan.  L upper cutaneous lip 4.30mm pearly pap with pigment globules    Assessment & Plan   SKIN  CANCER SCREENING PERFORMED TODAY.  ACTINIC DAMAGE - Chronic condition, secondary to cumulative UV/sun exposure - Recommend daily broad spectrum sunscreen SPF 30+ to sun-exposed areas, reapply every 2 hours as needed.  - Staying in the shade or wearing long sleeves, sun glasses (UVA+UVB protection) and wide brim hats (4-inch brim around the entire circumference of the hat) are also recommended for sun protection.  - Call for new or changing lesions.  LENTIGINES, SEBORRHEIC KERATOSES, HEMANGIOMAS - Benign normal skin lesions - Benign-appearing - Call for any changes  MELANOCYTIC NEVI - Tan-brown and/or pink-flesh-colored symmetric macules and papules - Benign appearing on exam today - Observation - Call clinic for new or changing moles - Recommend daily use of broad spectrum spf 30+ sunscreen to sun-exposed areas.   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 Condition is symptomatic/ bothersome to patient. Not currently at goal. Treatment Plan: Start Tretinoin  0.025% cr qhs to face   Superficial white onychomycosis of toenails vs Other Liver Labs from 09/23/23 viewed wnl Discussed rare association with HIV, immunosuppression - ordered rapid HIV today   Chronic and persistent condition with duration or expected duration over one year. Condition is symptomatic/ bothersome to patient. Not currently at goal.  Treatment Plan: Nail-Fungal-ID Molecular Diagnostic test performed today.  Discussed with patient their insurance will be billed.  Advised the patient they may get a bill for a portion that's not covered by their insurance.  Should the patient have any issues with their remaining responsibility they will not be sent to collections but KRISTINE will  work with them internally on any remaining balance.      NEOPLASM OF SKIN L upper cutaneous lip Skin / nail biopsy Type of biopsy: tangential    Informed consent: discussed and consent obtained   Anesthesia: the lesion was anesthetized in a standard fashion   Anesthesia comment:  Area prepped with alcohol Anesthetic:  1% lidocaine w/ epinephrine 1-100,000 buffered w/ 8.4% NaHCO3 Instrument used: flexible razor blade   Hemostasis achieved with: pressure, aluminum chloride and electrodesiccation   Outcome: patient tolerated procedure well   Post-procedure details: wound care instructions given   Post-procedure details comment:  Ointment and small bandage applied  Specimen 1 - Surgical pathology Differential Diagnosis: SK r/o BCC  Check Margins: No 4.60mm pearly pap with pigment globules TINEA UNGUIUM   Related Procedures Rapid HIV screen (HIV 1/2 Ab+Ag) SUPERFICIAL WHITE ONYCHOMYCOSIS   RHYTIDES   HYPERPIGMENTATION   SKIN EXAM FOR MALIGNANT NEOPLASM   LENTIGO   SEBORRHEIC KERATOSIS   CHERRY ANGIOMA   MULTIPLE BENIGN NEVI   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  I, Grayce Saunas, RMA, am acting as scribe for Lauraine JAYSON Kanaris, MD .   Documentation: I have reviewed the above documentation for accuracy and completeness, and I agree with the above.  Lauraine JAYSON Kanaris, MD

## 2023-10-11 ENCOUNTER — Telehealth: Payer: Self-pay

## 2023-10-11 ENCOUNTER — Ambulatory Visit: Payer: Self-pay

## 2023-10-11 DIAGNOSIS — C4401 Basal cell carcinoma of skin of lip: Secondary | ICD-10-CM

## 2023-10-11 LAB — SURGICAL PATHOLOGY

## 2023-10-11 NOTE — Telephone Encounter (Signed)
 Patients [employer] nail fungal ID results in media for review.   Thank you!

## 2023-10-12 ENCOUNTER — Other Ambulatory Visit: Payer: Self-pay

## 2023-10-12 DIAGNOSIS — B351 Tinea unguium: Secondary | ICD-10-CM

## 2023-10-12 LAB — HIV 1/2 BY ORAQUICK ADVANCE: HIV-1/2 BY OraQuick Advance: NONREACTIVE

## 2023-10-12 MED ORDER — CICLOPIROX 8 % EX SOLN: Freq: Every day | CUTANEOUS | 0 refills | Status: DC

## 2023-10-12 MED ORDER — FLUCONAZOLE 150 MG PO TABS: 150.0000 mg | ORAL_TABLET | ORAL | 2 refills | Status: AC

## 2023-10-12 MED ORDER — FLUCONAZOLE 150 MG PO TABS: 150.0000 mg | ORAL_TABLET | ORAL | 2 refills | Status: DC

## 2023-10-12 NOTE — Telephone Encounter (Signed)
Left message on voicemail to return my call.  

## 2023-10-12 NOTE — Telephone Encounter (Signed)
 Patient advised and referral send to Dr. Corey. aw

## 2023-10-12 NOTE — Telephone Encounter (Signed)
 Patient has been advised of all information per Dr. Raymund.   Patient did have lab work done. I have manually pulled it from [facility] and scanning into [facility] for Dr. Raymund to review. aw

## 2023-10-12 NOTE — Progress Notes (Signed)
 Transfer of pharmacy

## 2023-10-12 NOTE — Addendum Note (Signed)
 Addended by: TERESA PALMA R on: 10/12/2023 03:11 PM   Modules accepted: Orders

## 2023-10-12 NOTE — Telephone Encounter (Signed)
-----   Message from Lauraine JAYSON Kanaris sent at 10/11/2023  3:22 PM EDT -----  1. Skin, left upper cutaneous lip :       BASAL CELL CARCINOMA, NODULAR PATTERN   Mohs -   This is a type of skin cancer (NOT melanoma). These kinds of skin cancer typically do not represent an immediate threat to your health, but we recommend treating them because they do have the  potential to grow, bleed, invade local structures, and even spread to other parts of the body if they are not treated.    Given the size and location of the lesion of concern in your case, we recommend treatment with Mohs surgery. Mohs surgery is performed outpatient.    Mohs surgery begins with numbing injections (like the injections you received prior to your biopsy). Once the area is numb, the Mohs surgeon will remove the affected tissue and examine it under the  microscope. If there are any margins of the removed tissue that show residual cancer cells the surgeon will remove another small piece of tissue and possibly multiple pieces until the margins appear  to be clear of cancer cells. It can take an hour to process tissue each time so the process as a whole can take up to an entire day. Once the margins are clear, the Mohs surgeon will repair the area  with stitches (which usually dissolve on their own, but in special circumstances may need a brief nurse visit to remove).    Mohs surgery is an exceptionally definitive treatment option for skin cancers. It is a very precise surgical procedure which allows for the highest chance of removing all the cancerous tissue while  preserving as much of the normal tissue as possible. I will submit a referral to our Mohs surgeons, and you will be contacted by our office to schedule the procedure.    As always, please send us  a message or call our office   if you have any questions.   Thank you for allowing us  to care for you.  ----- Message ----- From: Interface, Lab In [facility] Sent:  10/11/2023   3:16 PM EDT To: Lauraine JAYSON Kanaris, MD

## 2023-10-12 NOTE — Telephone Encounter (Signed)
 Patient is going to call back for 3 month follow up. aw

## 2023-10-13 ENCOUNTER — Ambulatory Visit: Payer: Self-pay

## 2023-10-14 ENCOUNTER — Ambulatory Visit
Admission: RE | Admit: 2023-10-14 | Discharge: 2023-10-14 | Disposition: A | Discharge status: Home / Self Care | Source: Ambulatory Visit | Attending: Internal Medicine | Admitting: Internal Medicine

## 2023-10-14 DIAGNOSIS — Z1231 Encounter for screening mammogram for malignant neoplasm of breast: Secondary | ICD-10-CM | POA: Diagnosis present

## 2023-11-01 ENCOUNTER — Other Ambulatory Visit: Payer: Self-pay

## 2023-11-03 ENCOUNTER — Encounter: Payer: Self-pay | Admitting: Dermatology

## 2023-11-08 ENCOUNTER — Ambulatory Visit (INDEPENDENT_AMBULATORY_CARE_PROVIDER_SITE_OTHER): Admitting: Dermatology

## 2023-11-08 ENCOUNTER — Encounter: Payer: Self-pay | Admitting: Dermatology

## 2023-11-08 VITALS — BP 105/64 | HR 84 | Temp 97.6°F

## 2023-11-08 DIAGNOSIS — L814 Other melanin hyperpigmentation: Secondary | ICD-10-CM

## 2023-11-08 DIAGNOSIS — C4491 Basal cell carcinoma of skin, unspecified: Secondary | ICD-10-CM

## 2023-11-08 DIAGNOSIS — L578 Other skin changes due to chronic exposure to nonionizing radiation: Secondary | ICD-10-CM | POA: Diagnosis not present

## 2023-11-08 DIAGNOSIS — C4401 Basal cell carcinoma of skin of lip: Secondary | ICD-10-CM | POA: Diagnosis not present

## 2023-11-08 MED ORDER — TRAMADOL HCL 50 MG PO TABS: 50.0000 mg | ORAL_TABLET | Freq: Four times a day (QID) | ORAL | 0 refills | Status: AC | PRN

## 2023-11-08 MED ORDER — MUPIROCIN 2 % EX OINT: 1.0000 | TOPICAL_OINTMENT | Freq: Two times a day (BID) | CUTANEOUS | 0 refills | Status: AC

## 2023-11-08 NOTE — Progress Notes (Signed)
 Follow-Up Visit   Subjective  Deborah Mcdonald is a 58 y.o. 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 Lauraine Kanaris.   The following portions of the chart were reviewed this encounter and updated as appropriate: medications, allergies, medical history  Review of Systems:  No other skin or systemic complaints except as noted in HPI or Assessment and Plan.  Objective  Well appearing patient in no apparent distress; mood and affect are within normal limits.  A focused examination was performed of the following areas: Left upper cutaneous Relevant physical exam findings are noted in the Assessment and Plan.   left upper cutaneous lip Atrophic biopsy scar   Assessment & Plan   BASAL CELL CARCINOMA (BCC), UNSPECIFIED SITE left upper cutaneous lip Mohs surgery  Consent obtained: written  Anticoagulation: Is the patient taking prescription anticoagulant and/or aspirin prescribed/recommended by a physician? No   Was the anticoagulation regimen changed prior to Mohs? No    Anesthesia: Anesthesia method: local infiltration Local anesthetic: lidocaine 1% WITH epi  Procedure Details: Timeout: pre-procedure verification complete Procedure Prep: patient was prepped and draped in usual sterile fashion Prep type: chlorhexidine Biopsy accession number: [id number] Biopsy lab: [facility] Date of biopsy: 10/07/2023 Frozen section biopsy performed: No   Pre-Op diagnosis: basal cell carcinoma BCC subtype: nodular MohsAIQ Surgical site (if tumor spans multiple areas, please select predominant area): cutaneous lip Surgery side: left Surgical site (from skin exam): left upper cutaneous lip Pre-operative length (cm): 0.3 Pre-operative width (cm): 0.3 Indications for Mohs surgery: anatomic location where tissue conservation is critical  Micrographic Surgery Details: Post-operative  length (cm): 0.7 Post-operative width (cm): 0.6 Number of Mohs stages: 1 Post surgery depth of defect: subcutaneous fat Is this a complex case (associate members only): No    Stage 1    Tumor features identified on Mohs section: no tumor identified    Depth of tumor invasion after stage: subcutaneous fat  Patient tolerance of procedure: tolerated well, no immediate complications  Reconstruction: Was the defect reconstructed? Yes   Was reconstruction performed by the same Mohs surgeon? Yes   Setting of reconstruction: outpatient office When was reconstruction performed? same day Type of reconstruction: linear Linear reconstruction: complex  Opioids: Did the patient receive a prescription for opioid/narcotic related to Mohs surgery? Yes   Indications for opioid/narcotics: patient required additional pain relief despite trial of non-opioid analgesia  Antibiotics: Does patient meet AHA guidelines for endocarditis?: No   Does patient meet AHA guidelines for orthopedic prophylaxis?: No   Were antibiotics given on the day of surgery?: No   Did surgery breach mucosa, expose cartilage/bone, involve an area of lymphedema/inflamed/infected tissue? No    Skin repair Complexity:  Complex Final length (cm):  2.7 Informed consent: discussed and consent obtained   Timeout: patient name, date of birth, surgical site, and procedure verified   Procedure prep:  Patient was prepped and draped in usual sterile fashion Prep type:  Chlorhexidine Anesthesia: the lesion was anesthetized in a standard fashion   Anesthetic:  1% lidocaine w/ epinephrine 1-100,000 buffered w/ 8.4% NaHCO3 Reason for type of repair: reduce the risk of dehiscence, infection, and necrosis, reduce subcutaneous dead space and avoid a hematoma, preserve normal anatomy, avoid adjacent structures and allow side-to-side closure without requiring a flap or graft   Undermining: area extensively undermined   Subcutaneous layers (deep  stitches):  Suture size:  5-0 Suture  type: Monocryl (poliglecaprone 25)   Stitches:  Buried vertical mattress Fine/surface layer approximation (top stitches):  Suture size:  6-0 Suture type: fast-absorbing plain gut   Stitches: simple running   Hemostasis achieved with: suture, pressure and electrodesiccation Outcome: patient tolerated procedure well with no complications   Post-procedure details: sterile dressing applied and wound care instructions given   Dressing type: pressure dressing and petrolatum    Related Medications traMADol  (ULTRAM ) 50 MG tablet Take 1 tablet (50 mg total) by mouth every 6 (six) hours as needed for up to 8 days. mupirocin  ointment (BACTROBAN ) 2 % Apply 1 Application topically 2 (two) times daily.   Return in about 4 weeks (around 12/06/2023) for follow up.  LILLETTE Rollene Gobble, RN, am acting as scribe for RUFUS CHRISTELLA HOLY, MD .   11/08/2023  HISTORY OF PRESENT ILLNESS  Deborah Mcdonald is seen in consultation at the request of Dr. Raymund for biopsy-proven Nodular Basal Cell Carcinoma on the left upper cutaneous lip. They note that the area has been present for about 6 months increasing in size with time.  There is no history of previous treatment.  Reports no other new or changing lesions and has no other complaints today.  Medications and allergies: see patient chart.  Review of systems: Reviewed 8 systems and notable for the above skin cancer.  All other systems reviewed are unremarkable/negative, unless noted in the HPI. Past medical history, surgical history, family history, social history were also reviewed and are noted in the chart/questionnaire.    PHYSICAL EXAMINATION  General: Well-appearing, in no acute distress, alert and oriented x 4. Vitals reviewed in chart (if available).   Skin: Exam reveals a 0.3 x 0.3 cm erythematous papule and biopsy scar on the left upper cutaneous lip. There are rhytids, telangiectasias, and lentigines, consistent with  photodamage.  Biopsy report(s) reviewed, confirming the diagnosis.   ASSESSMENT  1) Nodular Basal Cell Carcinoma of the left upper cutaneous lip 2) photodamage 3) solar lentigines   PLAN   1. Due to location, size, histology, or recurrence and the likelihood of subclinical extension as well as the need to conserve normal surrounding tissue, the patient was deemed acceptable for Mohs micrographic surgery (MMS).  The nature and purpose of the procedure, associated benefits and risks including recurrence and scarring, possible complications such as pain, infection, and bleeding, and alternative methods of treatment if appropriate were discussed with the patient during consent. The lesion location was verified by the patient, by reviewing previous notes, pathology reports, and by photographs as well as angulation measurements if available.  Informed consent was reviewed and signed by the patient, and timeout was performed at 9:00 AM. See op note below.  2. For the photodamage and solar lentigines, sun protection discussed/information given on OTC sunscreens, and we recommend continued regular follow-up with primary dermatologist every 6 months or sooner for any growing, bleeding, or changing lesions. 3. Prognosis and future surveillance discussed. 4. Letter with treatment outcome sent to referring provider. 5. Pain acetaminophen/ibuprofen/tramadol  50 mg  MOHS MICROGRAPHIC SURGERY AND RECONSTRUCTION  Initial size:   0.3 x 0.3 cm Surgical defect/wound size: 0.7 x 0.6 cm Anesthesia:    0.33% lidocaine with 1:200,000 epinephrine EBL:    <5 mL Complications:  None Repair type:   Complex SQ suture:   5-0 Monocryl Cutaneous suture:  6-0 Plain gut Final size of the repair: 2.7 cm  Stages: 1  STAGE I: Anesthesia achieved with 0.5% lidocaine with 1:200,000 epinephrine. ChloraPrep applied. 1  section(s) excised using Mohs technique (this includes total peripheral and deep tissue margin excision and  evaluation with frozen sections, excised and interpreted by the same physician). The tumor was first debulked and then excised with an approx. 2mm margin.  Hemostasis was achieved with electrocautery as needed.  The specimen was then oriented, subdivided/relaxed, inked, and processed using Mohs technique.    Frozen section analysis revealed a clear deep and peripheral margin.   Reconstruction  The surgical wound was then cleaned, prepped, and re-anesthetized as above. Wound edges were undermined extensively along at least one entire edge and at a distance equal to or greater than the width of the defect (see wound defect size above) in order to achieve closure and decrease wound tension and anatomic distortion. Redundant tissue repair including standing cone removal was performed. Hemostasis was achieved with electrocautery. Subcutaneous and epidermal tissues were approximated with the above sutures. The surgical site was then lightly scrubbed with sterile, saline-soaked gauze. Yhe area was then bandaged using Vaseline ointment, non-adherent gauze, gauze pads, and tape to provide an adequate pressure dressing. The patient tolerated the procedure well, was given detailed written and verbal wound care instructions, and was discharged in good condition.   The patient will follow-up: 4 weeks.     Documentation: I have reviewed the above documentation for accuracy and completeness, and I agree with the above.  RUFUS CHRISTELLA HOLY, MD

## 2023-11-08 NOTE — Patient Instructions (Signed)

## 2023-11-11 ENCOUNTER — Encounter: Payer: Self-pay | Admitting: Dermatology

## 2023-11-29 ENCOUNTER — Other Ambulatory Visit: Payer: Self-pay

## 2023-11-29 ENCOUNTER — Emergency Department
Admission: EM | Admit: 2023-11-29 | Discharge: 2023-11-29 | Disposition: A | Discharge status: Home / Self Care | Attending: Emergency Medicine | Admitting: Emergency Medicine

## 2023-11-29 ENCOUNTER — Encounter: Payer: Self-pay | Admitting: *Deleted

## 2023-11-29 DIAGNOSIS — R1085 Abdominal pain of multiple sites: Secondary | ICD-10-CM | POA: Diagnosis not present

## 2023-11-29 DIAGNOSIS — R1013 Epigastric pain: Secondary | ICD-10-CM | POA: Diagnosis present

## 2023-11-29 DIAGNOSIS — Z85828 Personal history of other malignant neoplasm of skin: Secondary | ICD-10-CM | POA: Diagnosis not present

## 2023-11-29 LAB — CBC
HCT: 40.7 % (ref 36.0–46.0)
Hemoglobin: 13.7 g/dL (ref 12.0–15.0)
MCH: 30.1 pg (ref 26.0–34.0)
MCHC: 33.7 g/dL (ref 30.0–36.0)
MCV: 89.5 fL (ref 80.0–100.0)
Platelets: 268 K/uL (ref 150–400)
RBC: 4.55 MIL/uL (ref 3.87–5.11)
RDW: 12.8 % (ref 11.5–15.5)
WBC: 8.9 K/uL (ref 4.0–10.5)
nRBC: 0 % (ref 0.0–0.2)

## 2023-11-29 LAB — COMPREHENSIVE METABOLIC PANEL WITH GFR
ALT: 19 U/L (ref 0–44)
AST: 21 U/L (ref 15–41)
Albumin: 3.8 g/dL (ref 3.5–5.0)
Alkaline Phosphatase: 61 U/L (ref 38–126)
Anion gap: 11 (ref 5–15)
BUN: 14 mg/dL (ref 6–20)
CO2: 23 mmol/L (ref 22–32)
Calcium: 10.1 mg/dL (ref 8.9–10.3)
Chloride: 104 mmol/L (ref 98–111)
Creatinine, Ser: 0.6 mg/dL (ref 0.44–1.00)
GFR, Estimated: >60 mL/min (ref >60)
Glucose, Bld: 140 mg/dL — ABNORMAL HIGH (ref 70–99)
Potassium: 3.5 mmol/L (ref 3.5–5.1)
Sodium: 138 mmol/L (ref 135–145)
Total Bilirubin: 0.5 mg/dL (ref 0.0–1.2)
Total Protein: 7.2 g/dL (ref 6.5–8.1)

## 2023-11-29 LAB — URINALYSIS, ROUTINE W REFLEX MICROSCOPIC
Bilirubin Urine: NEGATIVE
Glucose, UA: NEGATIVE mg/dL
Ketones, ur: NEGATIVE mg/dL
Leukocytes,Ua: NEGATIVE
Nitrite: NEGATIVE
Protein, ur: NEGATIVE mg/dL
Specific Gravity, Urine: 1.023 (ref 1.005–1.030)
Squamous Epithelial / HPF: 0 /HPF (ref 0–5)
pH: 5 (ref 5.0–8.0)

## 2023-11-29 LAB — LIPASE, BLOOD: Lipase: 32 U/L (ref 11–51)

## 2023-11-29 MED ORDER — DICYCLOMINE HCL 10 MG PO CAPS
20.0000 mg | ORAL_CAPSULE | Freq: Once | ORAL | Status: AC
Start: 2023-11-29 — End: 2023-11-29
  Administered 2023-11-29: 20 mg via ORAL
  Filled 2023-11-29: qty 2

## 2023-11-29 MED ORDER — ONDANSETRON 4 MG PO TBDP: 4.0000 mg | ORAL_TABLET | Freq: Three times a day (TID) | ORAL | 0 refills | Status: AC | PRN

## 2023-11-29 MED ORDER — ONDANSETRON 8 MG PO TBDP
8.0000 mg | ORAL_TABLET | Freq: Once | ORAL | Status: AC
  Administered 2023-11-29: 8 mg via ORAL
  Filled 2023-11-29: qty 1

## 2023-11-29 MED ORDER — DICYCLOMINE HCL 10 MG PO CAPS: 10.0000 mg | ORAL_CAPSULE | Freq: Three times a day (TID) | ORAL | 0 refills | Status: AC

## 2023-11-29 NOTE — Discharge Instructions (Signed)
 Your exam and labs are normal at this time.  No signs of a serious infection and or concern for gallbladder disease.  Take the prescription meds as directed.  Follow-up with your primary provider or return to the ED as discussed.

## 2023-11-29 NOTE — ED Triage Notes (Signed)
 Pt ambulatory to triage.  Pt has intermittent abd pain since last night.  No n/v/d.  pt took tums, malox  without relief.  Pt denies chest pain or sob.  No back pain.  Pt alert  speech clear.

## 2023-11-29 NOTE — ED Provider Notes (Signed)
 [facility] Emergency Department Provider Note     Event Date/Time   First MD Initiated Contact with Patient 11/29/23 2038     (approximate)   History   Abdominal Pain   HPI  Deborah Mcdonald is a 58 y.o. female with a history of basal cell carcinoma, presents to the ED endorsing intermittent epigastric abdominal pain with onset last night.  She then had separate episode today of epigastric pain, followed by generalized abdominal bloating.  She denies any associated nausea, vomiting, bowel changes.  She took Tums last without significant benefit.  She denies any frank chest pain, shortness of breath, cough, or congestion.  No fever, chills, sweats reported.  No reports of any recent travel, sick contact, or bad food exposure.  Patient denies any current symptoms, noting symptoms have resolved earlier today.    Physical Exam   Triage Vital Signs: ED Triage Vitals  Encounter Vitals Group     BP 11/29/23 1908 117/69     Girls Systolic BP Percentile --      Girls Diastolic BP Percentile --      Boys Systolic BP Percentile --      Boys Diastolic BP Percentile --      Pulse Rate 11/29/23 1908 74     Resp 11/29/23 1908 18     Temp 11/29/23 1908 98.1 F (36.7 C)     Temp Source 11/29/23 1908 Oral     SpO2 11/29/23 1908 97 %     Weight 11/29/23 1909 135 lb (61.2 kg)     Height 11/29/23 1909 5' 2 (1.575 m)     Head Circumference --      Peak Flow --      Pain Score 11/29/23 1909 4     Pain Loc --      Pain Education --      Exclude from Growth Chart --     Most recent vital signs: Vitals:   11/29/23 1908  BP: 117/69  Pulse: 74  Resp: 18  Temp: 98.1 F (36.7 C)  SpO2: 97%    General Awake, no distress. NAD HEENT NCAT. PERRL. EOMI. No rhinorrhea. Mucous membranes are moist.  CV:  Good peripheral perfusion.  RESP:  Normal effort.  ABD:  No distention.  Soft and nontender.  Normoactive bowel sounds x 4.  No rebound, guarding, or rigidity  appreciated.  No CVA tenderness elicited.  ED Results / Procedures / Treatments   Labs (all labs ordered are listed, but only abnormal results are displayed) Labs Reviewed  COMPREHENSIVE METABOLIC PANEL WITH GFR - Abnormal; Notable for the following components:      Result Value   Glucose, Bld 140 (*)    All other components within normal limits  URINALYSIS, ROUTINE W REFLEX MICROSCOPIC - Abnormal; Notable for the following components:   Color, Urine YELLOW (*)    APPearance CLEAR (*)    Hgb urine dipstick MODERATE (*)    Bacteria, UA RARE (*)    All other components within normal limits  LIPASE, BLOOD  CBC    EKG   RADIOLOGY  No results found.   PROCEDURES:  Critical Care performed: No  Procedures   MEDICATIONS ORDERED IN ED: Medications  ondansetron (ZOFRAN-ODT) disintegrating tablet 8 mg (8 mg Oral Given 11/29/23 2154)  dicyclomine (BENTYL) capsule 20 mg (20 mg Oral Given 11/29/23 2154)     IMPRESSION / MDM / ASSESSMENT AND PLAN / ED COURSE  I reviewed the triage  vital signs and the nursing notes.                              Differential diagnosis includes, but is not limited to, Ovarian cyst, ovarian torsion, acute appendicitis, diverticulitis, urinary tract infection/pyelonephritis, bowel obstruction, colitis, renal colic, gastroenteritis, fibroids, biliary disease intrathoracic causes for epigastric abdominal pain including ACS, pancreatitis, abdominal aortic aneurysm, hernia, and ulcer(s).  Patient's presentation is most consistent with acute complicated illness / injury requiring diagnostic workup.  Patient's diagnosis is consistent with generalized abdominal pain of unclear etiology.  Patient presents endorsing now resolved epigastric pain that resulted in some generalized abdominal bloating.  She denies any associated nausea, vomiting, or bowel changes.  Patient presents in no acute distress, with no reports of actable vomiting.  Labs are reassuring and  vital signs are stable.  Discussed options for further imaging including CT ultrasound, but patient declined at this time.  Stable for outpatient management.  Patient will be discharged home with prescriptions for Zofran and Bentyl. Patient is to follow up with her PCP as needed or otherwise directed. Patient is given ED precautions to return to the ED for any worsening or new symptoms.   FINAL CLINICAL IMPRESSION(S) / ED DIAGNOSES   Final diagnoses:  Abdominal pain of multiple sites     Rx / DC Orders   ED Discharge Orders          Ordered    dicyclomine (BENTYL) 10 MG capsule  3 times daily before meals        11/29/23 2136    ondansetron (ZOFRAN-ODT) 4 MG disintegrating tablet  Every 8 hours PRN        11/29/23 2136             Note:  This document was prepared using Dragon voice recognition software and may include unintentional dictation errors.    Loyd Candida LULLA Aldona, PA-C 11/29/23 2217    Levander Slate, MD 11/29/23 2256

## 2023-12-06 ENCOUNTER — Telehealth: Payer: Self-pay

## 2023-12-06 NOTE — Telephone Encounter (Signed)
 Patient called LM on VM, she is calling to discuss her medication.   I returned patients call, no answer, VM full can not leave a message

## 2023-12-09 ENCOUNTER — Ambulatory Visit (INDEPENDENT_AMBULATORY_CARE_PROVIDER_SITE_OTHER): Admitting: Dermatology

## 2023-12-09 VITALS — BP 127/79 | HR 72

## 2023-12-09 DIAGNOSIS — L905 Scar conditions and fibrosis of skin: Secondary | ICD-10-CM

## 2023-12-09 DIAGNOSIS — Z85828 Personal history of other malignant neoplasm of skin: Secondary | ICD-10-CM | POA: Diagnosis not present

## 2023-12-09 DIAGNOSIS — C4491 Basal cell carcinoma of skin, unspecified: Secondary | ICD-10-CM

## 2023-12-09 NOTE — Patient Instructions (Signed)

## 2023-12-09 NOTE — Progress Notes (Unsigned)
   Follow Up Visit   Subjective  Deborah Mcdonald is a 58 y.o. female who presents for the following: follow up from Mohs surgery   The patient presents for follow up from Mohs surgery for a BCC on the left upper cutaneous lip, treated on 10/07/23, repaired with linear closure. The patient has been bandaging the wound as directed. The endorse the following concerns: none  The following portions of the chart were reviewed this encounter and updated as appropriate: medications, allergies, medical history  Review of Systems:  No other skin or systemic complaints except as noted in HPI or Assessment and Plan.  Objective  Well appearing patient in no apparent distress; mood and affect are within normal limits.  A focal examination was performed including scalp, head, face. All findings within normal limits unless otherwise noted below.  Healing wound with mild erythema  Relevant physical exam findings are noted in the Assessment and Plan.    Assessment & Plan   Scar s/p Mohs for [facility] on the left upper cutaneous lip, treated on 10/07/23, repaired with linear closure - Reassured that wound is healing well - No evidence of infection - No swelling, induration, purulence, dehiscence, or tenderness out of proportion to the clinical exam, see photo above - Discussed that scars take up to 12 months to mature from the date of surgery - Recommend SPF 30+ to scar daily to prevent purple color from UV exposure during scar maturation process - Discussed that erythema and raised appearance of scar will fade over the next 4-6 months - OK to start scar massage at 4-6 weeks post-op - Can consider silicone based products for scar healing starting at 6 weeks post-op  HISTORY OF BASAL CELL CARCINOMA OF THE SKIN - No evidence of recurrence today - Recommend regular full body skin exams - Recommend daily broad spectrum sunscreen SPF 30+ to sun-exposed areas, reapply every 2 hours as needed.  - Call if any new  or changing lesions are noted between office visits  Return for TBSE with brenda.  I, Darice Smock, CMA, am acting as scribe for RUFUS CHRISTELLA HOLY, MD.   Documentation: I have reviewed the above documentation for accuracy and completeness, and I agree with the above.  RUFUS CHRISTELLA HOLY, MD

## 2023-12-10 ENCOUNTER — Encounter: Payer: Self-pay | Admitting: Dermatology

## 2023-12-23 ENCOUNTER — Ambulatory Visit

## 2023-12-23 DIAGNOSIS — L821 Other seborrheic keratosis: Secondary | ICD-10-CM | POA: Diagnosis not present

## 2023-12-23 DIAGNOSIS — L988 Other specified disorders of the skin and subcutaneous tissue: Secondary | ICD-10-CM

## 2023-12-23 DIAGNOSIS — L814 Other melanin hyperpigmentation: Secondary | ICD-10-CM

## 2023-12-23 DIAGNOSIS — B351 Tinea unguium: Secondary | ICD-10-CM

## 2023-12-23 DIAGNOSIS — L578 Other skin changes due to chronic exposure to nonionizing radiation: Secondary | ICD-10-CM

## 2023-12-23 DIAGNOSIS — C4490 Unspecified malignant neoplasm of skin, unspecified: Secondary | ICD-10-CM

## 2023-12-23 DIAGNOSIS — W908XXA Exposure to other nonionizing radiation, initial encounter: Secondary | ICD-10-CM

## 2023-12-23 DIAGNOSIS — Z85828 Personal history of other malignant neoplasm of skin: Secondary | ICD-10-CM

## 2023-12-23 DIAGNOSIS — D1801 Hemangioma of skin and subcutaneous tissue: Secondary | ICD-10-CM | POA: Diagnosis not present

## 2023-12-23 DIAGNOSIS — L738 Other specified follicular disorders: Secondary | ICD-10-CM

## 2023-12-23 DIAGNOSIS — D229 Melanocytic nevi, unspecified: Secondary | ICD-10-CM

## 2023-12-23 MED ORDER — CICLOPIROX 8 % EX SOLN: Freq: Every day | CUTANEOUS | 3 refills | Status: AC

## 2023-12-23 MED ORDER — TRETINOIN 0.025 % EX CREA: TOPICAL_CREAM | Freq: Every day | CUTANEOUS | 5 refills | Status: AC

## 2023-12-23 MED ORDER — FLUCONAZOLE 150 MG PO TABS: ORAL_TABLET | ORAL | 0 refills | Status: AC

## 2023-12-23 NOTE — Patient Instructions (Signed)

## 2023-12-23 NOTE — Progress Notes (Signed)
 Subjective   Deborah Mcdonald is a 58 y.o. female who presents for the following: toenail recheck. Patient is established patient   Today patient reports: Pt currently using fluconazole  150 mg po QW and Penlac  nail lacquer, but states she was not given instructions on how long to take the fluconazole , and she would like to know how long to continue medication. Pt also states that Penlac  is hard to clean off after one week. Pt was told we had a laser imaging scan for skin cancers and she would like to discuss having imaging done. Recheck BCC site L upper cutaneous lip s/p Mohs with Dr. Corey.  Review of Systems:    No other skin or systemic complaints except as noted in HPI or Assessment and Plan.  The following portions of the chart were reviewed this encounter and updated as appropriate: medications, allergies, medical history  Relevant Medical History:  Personal history of non melanoma skin cancer - see medical history for full details   Objective  Well appearing patient in no apparent distress; mood and affect are within normal limits. Examination was performed of the: Focused Exam of: the face, feet, back, chest and toenails   Examination notable for: Angioma(s): Scattered red vascular papule(s)  , Lentigo/lentigines: Scattered pigmented macules that are tan to brown in color and are somewhat non-uniform in shape and concentrated in the sun-exposed areas, Nevus/nevi: Scattered well-demarcated, regular, pigmented macule(s) and/or papule(s)  , Seborrheic Keratosis(es): Stuck-on appearing keratotic papule(s) on the trunk, none  irritated with redness, crusting, edema, and/or partial avulsion, Actinic Damage/Elastosis: chronic sun damage: dyspigmentation, telangiectasia, and wrinkling  Superficial white onychomycosis all nails, nail fold clear  Examination limited by: Clothing     Assessment & Plan   BENIGN SKIN FINDINGS  - Lentigines  - Seborrheic keratoses  - Hemangiomas   -  Nevus/Multiple Benign Nevi - Reassurance provided regarding the benign appearance of lesions noted on exam today; no treatment is indicated in the absence of symptoms/changes. - Reinforced importance of photoprotective strategies including liberal and frequent sunscreen use of a broad-spectrum SPF 30 or greater, use of protective clothing, and sun avoidance for prevention of cutaneous malignancy and photoaging.  Counseled patient on the importance of regular self-skin monitoring as well as routine clinical skin examinations as scheduled.   ACTINIC DAMAGE - Chronic condition, secondary to cumulative UV/sun exposure - Recommend daily broad spectrum sunscreen SPF 30+ to sun-exposed areas, reapply every 2 hours as needed.  - Staying in the shade or wearing long sleeves, sun glasses (UVA+UVB protection) and wide brim hats (4-inch brim around the entire circumference of the hat) are also recommended for sun protection.  - Call for new or changing lesions.  Personal history of non melanoma skin cancer  - Reviewed medical history for full details  - Reviewed sun protective measures as above - Encouraged full body skin exams   Superficial white onychomycosis positive for malassezia on 10/07/23 - improving but not at goal  - Continue fluconazole  150 mg po QW for 3 more months (discussed may need 12-18 mo course)  - Previously discussed rare association w immunosuppression - hiv negative  - Continue Penlac  nail lacquer QD clean off with alcohol after 7 days. - Continue spraying shoes and socks with antifungal medication OTC - LFT's normal per previous studies, asymptomatic, not alcoholic - Advised to decrease continue monitoring for side effects (anorexia, abdominal pain, jaundice with dark urine, yellowing skin/sclera) and to stop medication immediately if develops this or dysguesia (  metallic), or a hypersensitivity rash  Pt c/o abdominal pain two weeks ago - recommend being evaluated by PCP, pt  concerned she may have gallbladder issues. Labs reviewed, wnl   FACIAL ELASTOSIS Continue tretinoin  0.025% cream QHS. Topical retinoid medications like tretinoin /Retin-A , adapalene/Differin, tazarotene/Fabior, and Epiduo/Epiduo Forte can cause dryness and irritation when first started. Only apply a pea-sized amount to the entire affected area. Avoid applying it around the eyes, edges of mouth and creases at the nose. If you experience irritation, use a good moisturizer first and/or apply the medicine less often. If you are doing well with the medicine, you can increase how often you use it until you are applying every night. Be careful with sun protection while using this medication as it can make you sensitive to the sun. This medicine should not be used by pregnant women.   Recommend Eucerin Radiant products OTC for dark spots. Samples given today.    Procedures, orders, diagnosis for this visit:    There are no diagnoses linked to this encounter.  Return to clinic: Return in about 6 months (around 06/21/2024) for TBSE - hx BCC.  Deborah Mcdonald, CMA, am acting as scribe for Lauraine JAYSON Kanaris, MD .  Documentation: I have reviewed the above documentation for accuracy and completeness, and I agree with the above.  Lauraine JAYSON Kanaris, MD

## 2024-04-12 ENCOUNTER — Ambulatory Visit: Admitting: Physician Assistant

## 2024-06-22 ENCOUNTER — Ambulatory Visit
